# Patient Record
Sex: Female | Born: 2000 | Race: Black or African American | Hispanic: No | Marital: Single | Smoking: Never smoker
Health system: Southern US, Community
[De-identification: ages and names within clinical notes are randomized; demographics above are authoritative.]

## PROBLEM LIST (undated history)

## (undated) HISTORY — PX: MULTIPLE TOOTH EXTRACTIONS: SHX2053

---
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
## 2018-09-03 DIAGNOSIS — M79645 Pain in left finger(s): Secondary | ICD-10-CM | POA: Diagnosis not present

## 2018-09-03 DIAGNOSIS — R531 Weakness: Secondary | ICD-10-CM | POA: Diagnosis not present

## 2018-10-05 DIAGNOSIS — H6122 Impacted cerumen, left ear: Secondary | ICD-10-CM | POA: Diagnosis not present

## 2018-10-20 DIAGNOSIS — H6123 Impacted cerumen, bilateral: Secondary | ICD-10-CM | POA: Diagnosis not present
